# Patient Record
Sex: Male | Born: 1980
Health system: Southern US, Community
[De-identification: ages and names within clinical notes are randomized; demographics above are authoritative.]

---

## 2011-10-02 ENCOUNTER — Ambulatory Visit: Payer: BC Managed Care – PPO | Admitting: Emergency Medicine

## 2011-10-02 VITALS — BP 136/89 | HR 52 | Temp 98.2°F | Resp 16 | Ht 69.75 in | Wt 219.4 lb

## 2011-10-02 DIAGNOSIS — L259 Unspecified contact dermatitis, unspecified cause: Secondary | ICD-10-CM

## 2011-10-02 DIAGNOSIS — R21 Rash and other nonspecific skin eruption: Secondary | ICD-10-CM

## 2011-10-02 DIAGNOSIS — L255 Unspecified contact dermatitis due to plants, except food: Secondary | ICD-10-CM

## 2011-10-02 MED ORDER — DESONIDE 0.05 % EX CREA: TOPICAL_CREAM | Freq: Three times a day (TID) | CUTANEOUS | Status: AC

## 2011-10-02 MED ORDER — METHYLPREDNISOLONE ACETATE 80 MG/ML IJ SUSP
80.0000 mg | Freq: Once | INTRAMUSCULAR | Status: AC
  Administered 2011-10-02: 80 mg via INTRAMUSCULAR

## 2011-10-02 MED ORDER — ACYCLOVIR 400 MG PO TABS: 400.0000 mg | ORAL_TABLET | Freq: Three times a day (TID) | ORAL | Status: AC

## 2011-10-02 NOTE — Progress Notes (Signed)
   Patient Name: Oscar Adams Date of Birth: Jun 23, 1980 Medical Record Number: [id number] Gender: male Date of Encounter: 10/02/2011  History of Present Illness:  Oscar Adams is a 31 y.o. very pleasant male patient who presents with the following:  Has history of chronic herpes simplex one on forehead.  Thinks he has a disseminated outbreak on his arms and legs as well as a rash on his abdomen at his belt buckle.  No other allergen contact nor use of new hygeine products  There is no problem list on file for this patient.  No past medical history on file. No past surgical history on file. History  Substance Use Topics  . Smoking status: Never Smoker   . Smokeless tobacco: Not on file  . Alcohol Use: Not on file   No family history on file. No Known Allergies  Medication list has been reviewed and updated.  Prior to Admission medications   Not on File    Review of Systems:  As per HPI, otherwise negative.    Physical Examination: Filed Vitals:   10/02/11 1054  BP: 136/89  Pulse: 52  Temp: 98.2 F (36.8 C)  Resp: 16   Filed Vitals:   10/02/11 1054  Height: 5' 9.75" (1.772 m)  Weight: 219 lb 6.4 oz (99.519 kg)   Body mass index is 31.71 kg/(m^2). Ideal Body Weight: Weight in (lb) to have BMI = 25: 172.6   GEN: WDWN, NAD, Non-toxic, A & O x 3 HEENT: Atraumatic, Normocephalic. Neck supple. No masses, No LAD. Ears and Nose: No external deformity. CV: RRR, No M/G/R. No JVD. No thrill. No extra heart sounds. PULM: CTA B, no wheezes, crackles, rhonchi. No retractions. No resp. distress. No accessory muscle use. ABD: S, NT, ND, +BS. No rebound. No HSM.  Weeping rash on mid lower abdomen at beltbuckle EXTR: No c/c/e NEURO Normal gait.  PSYCH: Normally interactive. Conversant. Not depressed or anxious appearing.  Calm demeanor.   Assessment and Plan: Contact dermatitis  Carmelina Dane, MD

## 2011-10-02 NOTE — Patient Instructions (Addendum)

## 2018-01-20 ENCOUNTER — Emergency Department (HOSPITAL_COMMUNITY)
Admission: EM | Admit: 2018-01-20 | Discharge: 2018-01-20 | Disposition: A | Discharge status: Home / Self Care | Payer: 59 | Attending: Emergency Medicine | Admitting: Emergency Medicine

## 2018-01-20 ENCOUNTER — Other Ambulatory Visit: Payer: Self-pay

## 2018-01-20 ENCOUNTER — Emergency Department (HOSPITAL_COMMUNITY): Payer: 59

## 2018-01-20 ENCOUNTER — Encounter (HOSPITAL_COMMUNITY): Payer: Self-pay

## 2018-01-20 DIAGNOSIS — Y9375 Activity, martial arts: Secondary | ICD-10-CM | POA: Insufficient documentation

## 2018-01-20 DIAGNOSIS — X509XXA Other and unspecified overexertion or strenuous movements or postures, initial encounter: Secondary | ICD-10-CM | POA: Diagnosis not present

## 2018-01-20 DIAGNOSIS — S43101A Unspecified dislocation of right acromioclavicular joint, initial encounter: Secondary | ICD-10-CM | POA: Diagnosis not present

## 2018-01-20 DIAGNOSIS — Y92838 Other recreation area as the place of occurrence of the external cause: Secondary | ICD-10-CM | POA: Insufficient documentation

## 2018-01-20 DIAGNOSIS — S4991XA Unspecified injury of right shoulder and upper arm, initial encounter: Secondary | ICD-10-CM | POA: Diagnosis present

## 2018-01-20 DIAGNOSIS — Y998 Other external cause status: Secondary | ICD-10-CM | POA: Diagnosis not present

## 2018-01-20 MED ORDER — KETOROLAC TROMETHAMINE 30 MG/ML IJ SOLN
15.0000 mg | Freq: Once | INTRAMUSCULAR | Status: AC
  Administered 2018-01-20: 15 mg via INTRAMUSCULAR
  Filled 2018-01-20: qty 1

## 2018-01-20 MED ORDER — IBUPROFEN 400 MG PO TABS: 400.0000 mg | ORAL_TABLET | Freq: Three times a day (TID) | ORAL | 0 refills | Status: AC

## 2018-01-20 NOTE — Discharge Instructions (Signed)
Please be sure to follow-up with our orthopedic colleagues for further evaluation and management.

## 2018-01-20 NOTE — ED Provider Notes (Signed)
[facility]-EMERGENCY DEPT Provider Note   CSN: [id number] Arrival date & time: 01/20/18  7829     History   Chief Complaint Chief Complaint  Patient presents with  . Shoulder Injury    HPI Oscar Adams is a 37 y.o. male.  HPI Presents with acute onset right shoulder pain. Patient was in jujitsu practice, had his arm pulled by another participant, and felt sudden onset of pain, which has been persistent since that time. This occurred 1 hour ago. Pain is sharp, severe, focally in the superior aspect of the shoulder. No distal loss of sensation or weakness, nor other injuries. No medication taken for pain relief. Pain is worse with any motion of the arm.  History reviewed. No pertinent past medical history.  There are no active problems to display for this patient.   History reviewed. No pertinent surgical history.      Home Medications    Prior to Admission medications   Not on File    Family History Family History  Problem Relation Age of Onset  . Healthy Mother   . Healthy Father     Social History Social History   Tobacco Use  . Smoking status: Never Smoker  . Smokeless tobacco: Never Used  Substance Use Topics  . Alcohol use: Yes    Comment: moderate use  . Drug use: Never     Allergies   Patient has no known allergies.   Review of Systems Review of Systems  Constitutional: Negative for fever.  Respiratory: Negative for shortness of breath.   Cardiovascular: Negative for chest pain.  Musculoskeletal:       Negative aside from HPI  Skin:       Negative aside from HPI  Allergic/Immunologic: Negative for immunocompromised state.  Neurological: Negative for weakness.     Physical Exam Updated Vital Signs BP (!) 139/98 (BP Location: Left Arm)   Pulse 84   Temp 98.1 F (36.7 C) (Oral)   Resp 16   Ht 5\' 10"  (1.778 m)   Wt 88.5 kg   SpO2 100%   BMI 27.98 kg/m   Physical Exam  Constitutional: He is oriented  to person, place, and time. He appears well-developed. No distress.  HENT:  Head: Normocephalic and atraumatic.  Eyes: Conjunctivae and EOM are normal.  Cardiovascular: Normal rate, regular rhythm and intact distal pulses.  Pulmonary/Chest: Effort normal. No respiratory distress.  Musculoskeletal: He exhibits no edema.       Left shoulder: Normal.       Right elbow: Normal.      Arms: Neurological: He is alert and oriented to person, place, and time.  Skin: Skin is warm and dry.  Psychiatric: He has a normal mood and affect.  Nursing note and vitals reviewed.    ED Treatments / Results  Labs (all labs ordered are listed, but only abnormal results are displayed) Labs Reviewed - No data to display  EKG None  Radiology Dg Shoulder Right  Result Date: 01/20/2018 CLINICAL DATA:  Injury while practicing jujitsu EXAM: RIGHT SHOULDER - 2+ VIEW COMPARISON:  None. FINDINGS: Frontal and Y scapular images were obtained. There is complete acromioclavicular separation with the clavicle superior to and slightly overlapping the medial acromion. There is milder apparent coracoclavicular separation. There is no fracture or frank dislocation. No appreciable joint space narrowing or erosion. No intra-articular calcification. Visualized right lung clear. IMPRESSION: Complete acromioclavicular separation with mild overriding of the lateral clavicle and medial acromion. The clavicle is  displaced superior to the acromion. There is milder coracoclavicular separation. No fracture or frank dislocation. No underlying arthropathy. Electronically Signed   By: Bretta Bang III M.D.   On: 01/20/2018 08:55    Procedures Procedures (including critical care time)  Medications Ordered in ED Medications  ketorolac (TORADOL) 30 MG/ML injection 15 mg (15 mg Intramuscular Given 01/20/18 0830)     Initial Impression / Assessment and Plan / ED Course  I have reviewed the triage vital signs and the nursing  notes.  Pertinent labs & imaging results that were available during my care of the patient were reviewed by me and considered in my medical decision making (see chart for details).     9:05 AM On repeat exam the patient is awake and alert, is now in a sling. We reviewed the x-ray images together, and I demonstrated the [facility] separation to him. Patient will follow-up with orthopedics w ongoing therapy with NSAID and ice.   Final Clinical Impressions(s) / ED Diagnoses   Final diagnoses:  AC separation, right, initial encounter     Gerhard Munch, MD 01/20/18 [id number]

## 2018-01-20 NOTE — ED Notes (Signed)
Patient transported to X-ray 

## 2018-01-20 NOTE — ED Triage Notes (Signed)
Patient was at [facility] practice and heard and felt a pop of the right shoulder.

## 2018-01-20 NOTE — ED Notes (Signed)
After immobilizer applied, good distal pulse and cap refills

## 2019-10-13 IMAGING — CR DG SHOULDER 2+V*R*
2 series · 2 of 2 positions shown · non-contrast
Comparison: None.

CLINICAL DATA: Injury while practicing jujitsu

EXAM:
RIGHT SHOULDER - 2+ VIEW

[w shoulder external right]
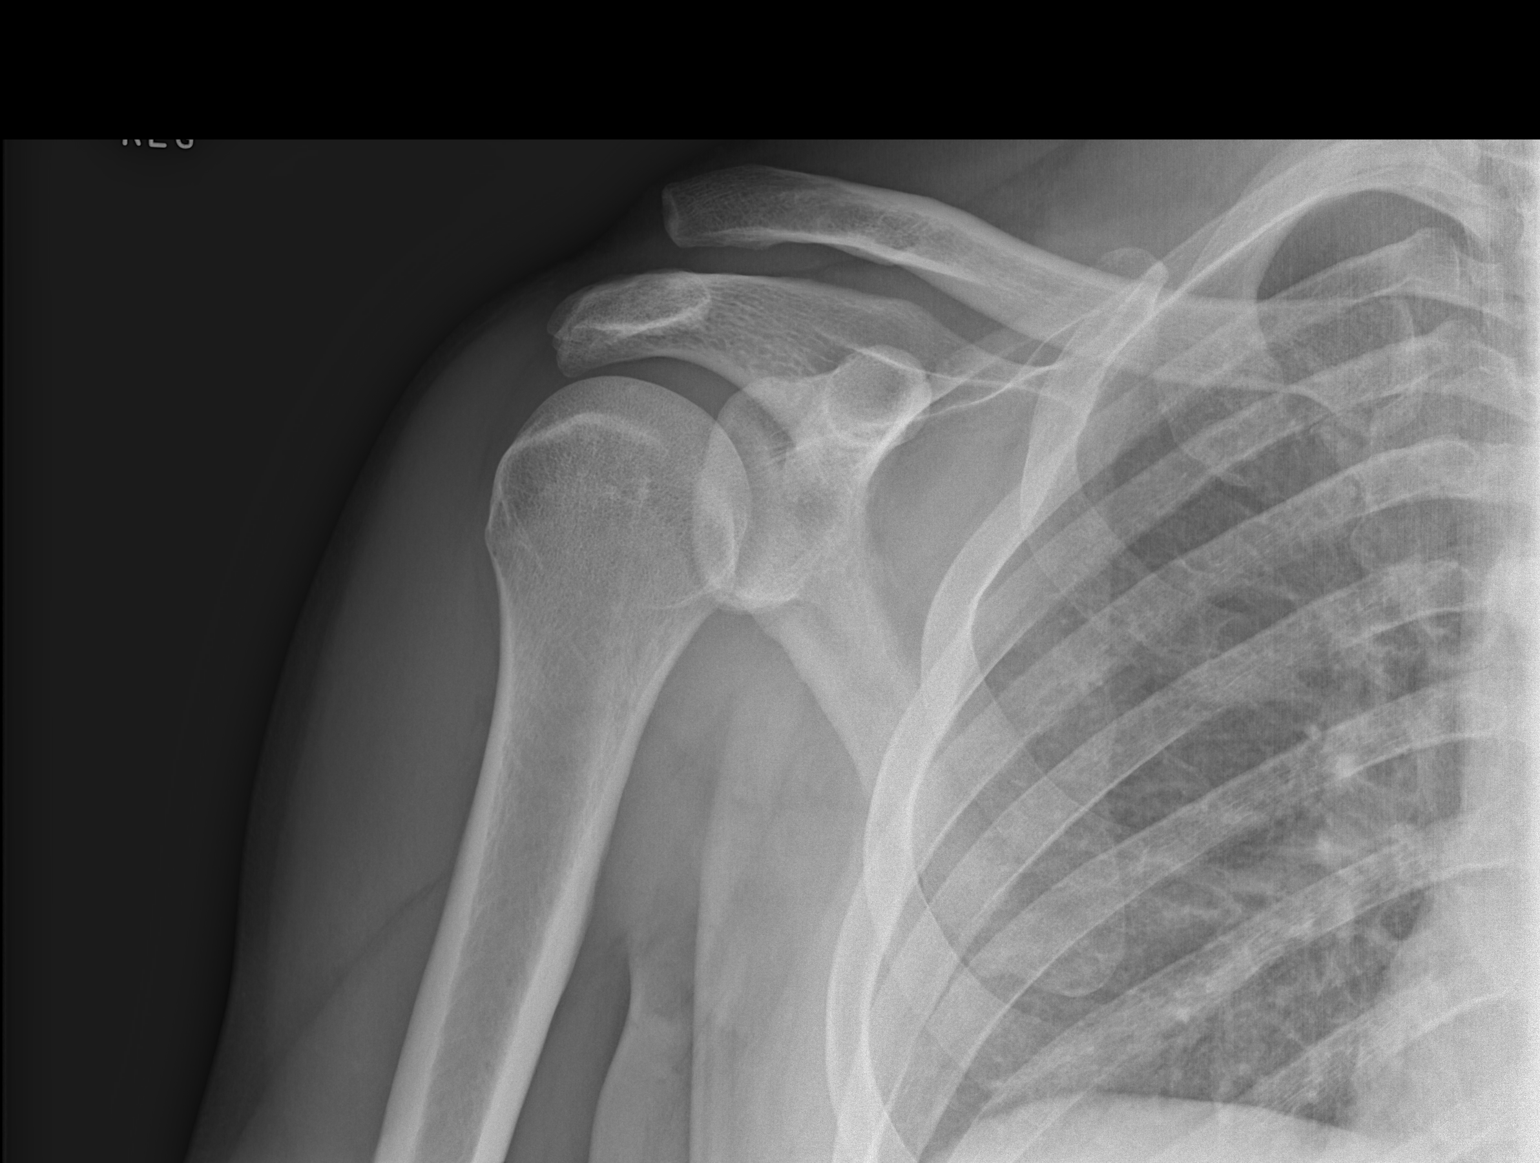

[w shoulder y-view right]
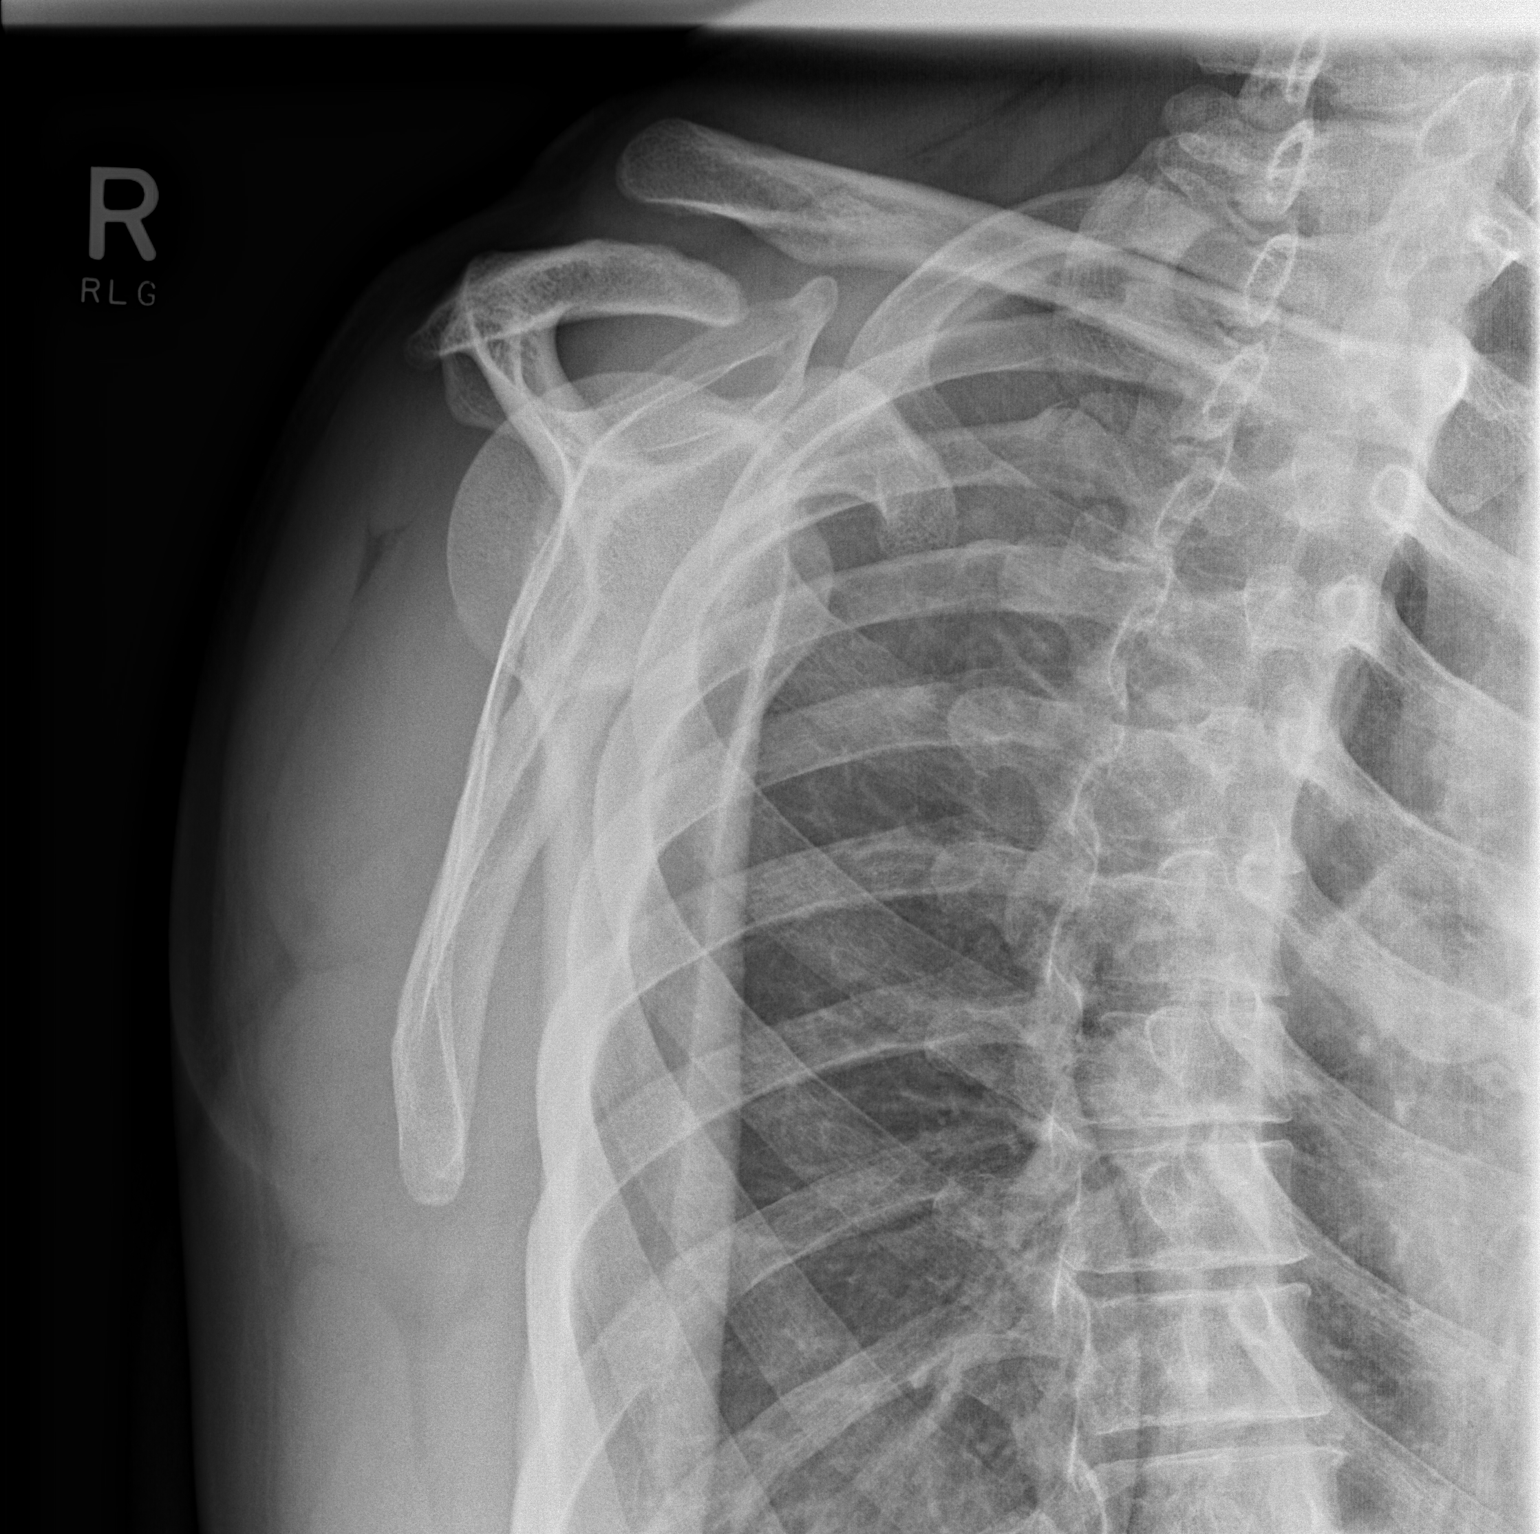

[2 of 2 positions shown; findings below may reference images not displayed]

FINDINGS: Frontal and Y scapular images were obtained. There is complete
acromioclavicular separation with the clavicle superior to and
slightly overlapping the medial acromion. There is milder apparent
coracoclavicular separation. There is no fracture or frank
dislocation. No appreciable joint space narrowing or erosion. No
intra-articular calcification. Visualized right lung clear.
IMPRESSION: Complete acromioclavicular separation with mild overriding of the
lateral clavicle and medial acromion. The clavicle is displaced
superior to the acromion. There is milder coracoclavicular
separation. No fracture or frank dislocation. No underlying
arthropathy.

## 2021-02-25 ENCOUNTER — Ambulatory Visit: Payer: 59 | Admitting: Family Medicine

## 2021-03-02 ENCOUNTER — Ambulatory Visit (INDEPENDENT_AMBULATORY_CARE_PROVIDER_SITE_OTHER): Payer: Managed Care, Other (non HMO) | Admitting: Family Medicine

## 2021-03-02 VITALS — BP 150/94 | Ht 70.0 in | Wt 195.0 lb

## 2021-03-02 DIAGNOSIS — S4352XA Sprain of left acromioclavicular joint, initial encounter: Secondary | ICD-10-CM

## 2021-03-02 NOTE — Progress Notes (Addendum)
    SUBJECTIVE:   CHIEF COMPLAINT / HPI:   Left shoulder pain: Patient states that for about 2 weeks or so he has been having left shoulder pain.  He states he has been doing jujitsu as well as lifting particularly doing lots of push-ups in order to try to put on muscle and noted pain at the anterior part of his left shoulder.  He denies any specific traumas.  States that he has been put in the "triangle" while sparring with jujitsu a few times but denies any specific time that caused sharp pain.  He states that he is reduced his lifting and stopped doing push-ups for the past 2 weeks and his pain has mostly improved.  He denies pain with movement of his shoulder.   OBJECTIVE:   BP (!) 150/94   Ht 5\' 10"  (1.778 m)   Wt 195 lb (88.5 kg)   BMI 27.98 kg/m    General: NAD, pleasant, able to participate in exam MSK: Shoulder, left: No evidence of bony deformity, asymmetry, or muscle atrophy; No tenderness over long head of biceps (bicipital groove).  Mild TTP at [facility] joint. Full active and passive range of motion (180 flex [facility] /150Abd /90ER /70IR), Thumb to T12 without significant tenderness. Strength 5/5 throughout. No abnormal scapular function observed. Sensation intact. Peripheral pulses intact.  Special testing: Negative Yergason's, negative empty can, negative drop arm test, with cross arm AC testing does have mild amount of discomfort in the left AC.  ASSESSMENT/PLAN:   Left AC joint sprain: 40 year old male with left shoulder pain for 2 weeks that worsened with jujitsu as well as lifting weights particularly push-ups.  He has stopped lifting weights and stopped doing push-ups for about 2 weeks and his pain as significantly improved and mostly resolved.  On physical exam he has some minor discomfort with the left AC with palpation which is exacerbated when doing a cross arm AC special testing.  Most likely grade 1 sprain.  Denies any trauma to the area but has noticed it worsening  after being put in the "triangle" maneuver during jujitsu which does put stress on the clavicle and AC joint.  Discussed easing back into lifting and exercise using pain as a guide.  No indication for imaging.  Follow-up as needed.  41, DO [facility]

## 2021-03-03 ENCOUNTER — Encounter: Payer: Self-pay | Admitting: Family Medicine

## 2021-03-03 NOTE — Addendum Note (Signed)
Addended by: Lenda Kelp on: 03/03/2021 09:03 AM   Modules accepted: Level of Service

## 2022-02-16 ENCOUNTER — Ambulatory Visit
Admission: RE | Admit: 2022-02-16 | Discharge: 2022-02-16 | Disposition: A | Discharge status: Home / Self Care | Payer: Managed Care, Other (non HMO) | Source: Ambulatory Visit | Attending: Chiropractic Medicine | Admitting: Chiropractic Medicine

## 2022-02-16 ENCOUNTER — Other Ambulatory Visit: Payer: Self-pay | Admitting: Chiropractic Medicine

## 2022-02-16 DIAGNOSIS — M542 Cervicalgia: Secondary | ICD-10-CM

## 2022-03-18 ENCOUNTER — Encounter: Payer: Self-pay | Admitting: Neurology

## 2022-03-18 ENCOUNTER — Ambulatory Visit (INDEPENDENT_AMBULATORY_CARE_PROVIDER_SITE_OTHER): Payer: Managed Care, Other (non HMO) | Admitting: Neurology

## 2022-03-18 VITALS — BP 128/78 | HR 42 | Ht 70.0 in | Wt 195.5 lb

## 2022-03-18 DIAGNOSIS — M5412 Radiculopathy, cervical region: Secondary | ICD-10-CM | POA: Diagnosis not present

## 2022-03-18 MED ORDER — MELOXICAM 15 MG PO TABS: 15.0000 mg | ORAL_TABLET | Freq: Every day | ORAL | 5 refills | Status: AC

## 2022-03-18 MED ORDER — GABAPENTIN 300 MG PO CAPS: 600.0000 mg | ORAL_CAPSULE | Freq: Three times a day (TID) | ORAL | 5 refills | Status: AC

## 2022-03-18 NOTE — Progress Notes (Signed)
Chief Complaint  Patient presents with   New Patient (Initial Visit)    Rm 15. Alone. 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      ASSESSMENT AND PLAN  Oscar Adams is a 41 y.o. male   Right cervical radiculopathy  Ongoing for 6 months, failed NSAIDs,  X-ray showed multilevel degenerative changes  MRI of cervical spine  EMG nerve conduction study  Gabapentin 300 mg and titrating to 600 mg 3 times daily as needed, Mobic 15 mg as needed  DIAGNOSTIC DATA (LABS, IMAGING, TESTING) - I reviewed patient records, labs, notes, testing and imaging myself where available.   MEDICAL HISTORY:  Thomson Herbers, is a 41 year old right-handed male, seen in request by primary care physician Dr. Nicholos Johns, [facility], for right neck pain radiating pain to right upper extremity, initial evaluation March 18, 2022  I reviewed and summarized the referring note.PMHX.  He works as a Quarry manager, does workout regularly, including weightlifting, jujitsu  Since June 2023, he began to have right shoulder blade pain, in July 2023, it becomes much worse, constant, difficulty finding a comfortable position, woke up in pain,  Was not able to continue his workout, denies left upper extremity, lower extremity weakness, denies significant right upper extremity weakness,  Personally reviewed x-ray of cervical February 19, 2022, moderate right foraminal stenosis C2-3, degenerative disc disease C5 7   PHYSICAL EXAM:   Vitals:   03/18/22 0823  BP: 128/78  Pulse: (!) 42  Weight: 195 lb 8 oz (88.7 kg)  Height: 5\' 10"  (1.778 m)   Not recorded     Body mass index is 28.05 kg/m.  PHYSICAL EXAMNIATION:  Gen: NAD, conversant, well nourised, well groomed                     Cardiovascular: Regular rate rhythm, no peripheral edema, warm, nontender. Eyes: Conjunctivae clear without exudates or hemorrhage Neck: Supple, no carotid bruits. Pulmonary: Clear to  auscultation bilaterally   NEUROLOGICAL EXAM:  MENTAL STATUS: Speech/cognition: Awake, alert, oriented to history taking and casual conversation CRANIAL NERVES: CN II: Visual fields are full to confrontation. Pupils are round equal and briskly reactive to light. CN III, IV, VI: extraocular movement are normal. No ptosis. CN V: Facial sensation is intact to light touch CN VII: Face is symmetric with normal eye closure  CN VIII: Hearing is normal to causal conversation. CN IX, X: Phonation is normal. CN XI: Head turning and shoulder shrug are intact  MOTOR: Mild right shoulder abduction, external rotation, grip, finger abduction, right wrist flexion weakness  REFLEXES: Reflexes R/L:  biceps 2/2+, triceps 1/2, knees 3/3, and ankles. Plantar responses are flexor.  SENSORY: Intact to light touch, pinprick and vibratory sensation are intact in fingers and toes.  COORDINATION: There is no trunk or limb dysmetria noted.  GAIT/STANCE: Posture is normal. Gait is steady with normal steps, base, arm swing, and turning. Heel and toe walking are normal. Tandem gait is normal.  Romberg is absent.  REVIEW OF SYSTEMS:  Full 14 system review of systems performed and notable only for as above All other review of systems were negative.   ALLERGIES: No Known Allergies  HOME MEDICATIONS: No current outpatient medications on file.   No current facility-administered medications for this visit.    PAST MEDICAL HISTORY: History reviewed. No pertinent past medical history.  PAST SURGICAL HISTORY: History reviewed. No pertinent surgical history.  FAMILY HISTORY: Family History  Problem Relation Age of Onset   Healthy  Mother    Healthy Father     SOCIAL HISTORY: Social History   Socioeconomic History   Marital status: Single    Spouse name: Not on file   Number of children: Not on file   Years of education: Not on file   Highest education level: Not on file  Occupational History    Not on file  Tobacco Use   Smoking status: Never   Smokeless tobacco: Never  Vaping Use   Vaping Use: Never used  Substance and Sexual Activity   Alcohol use: Yes    Comment: moderate use   Drug use: Never   Sexual activity: Not on file  Other Topics Concern   Not on file  Social History Narrative   Not on file   Social Determinants of Health   Financial Resource Strain: Not on file  Food Insecurity: Not on file  Transportation Needs: Not on file  Physical Activity: Not on file  Stress: Not on file  Social Connections: Not on file  Intimate Partner Violence: Not on file      Levert Feinstein, M.D. Ph.D.  [facility] [address] [place], [place] [address] Ph: [phone] Fax: [phone]  CC:  Georgianne Fick, MD [address] [place],  [place] [address]  Georgianne Fick, MD

## 2022-03-19 ENCOUNTER — Telehealth: Payer: Self-pay | Admitting: Neurology

## 2022-03-19 NOTE — Telephone Encounter (Signed)
Sent to [employer], they obtain [place].

## 2022-04-13 ENCOUNTER — Encounter: Payer: Self-pay | Admitting: Neurology

## 2022-04-27 NOTE — Telephone Encounter (Signed)
I do not see where this MRI was sent anywhere else either

## 2022-04-27 NOTE — Telephone Encounter (Signed)
Pt is calling. Requesting a call back to go over MRI results.

## 2022-04-28 ENCOUNTER — Encounter: Payer: Self-pay | Admitting: Neurology

## 2022-04-28 NOTE — Telephone Encounter (Addendum)
Please call patient, MRI of cervical spine done at [facility] showed multilevel degenerative changes, most noticeable at C5-6, with described severe spinal canal stenosis, variable degree of foraminal narrowing  Please put him on scheduled this coming Friday, April 30, 2022 for EMG nerve conduction study, advised him to bring MRI CD for [place] to review    C5-6: Degenerative changes, including a large right-sided disc-osteophyte complex that involves the midline and right lateral spinal canal and is continuous laterally with a right uncovertebral osteophyte. Severe spinal canal stenosis (right greater than left). Severe right neural foraminal stenosis. Moderate left neural foraminal stenosis.

## 2022-04-28 NOTE — Telephone Encounter (Signed)
Attempted to reach out to patient and did not receive an answer, if he calls back will you please ask where his scan was completed and if he knows the date? We are unable to find record of the MRI being completed. thanks

## 2022-04-28 NOTE — Telephone Encounter (Signed)
I called the pt and we discussed results. He verbalized understanding and agreement to the plan.  He will report to the clinic on 04/30/2022 for Nerve Conduction at 1130 check in at 11 am. He reports he has copy of MRI and will bring to his appt on Friday.

## 2022-04-30 ENCOUNTER — Ambulatory Visit (INDEPENDENT_AMBULATORY_CARE_PROVIDER_SITE_OTHER): Payer: Managed Care, Other (non HMO) | Admitting: Neurology

## 2022-04-30 ENCOUNTER — Encounter: Payer: Managed Care, Other (non HMO) | Admitting: Neurology

## 2022-04-30 ENCOUNTER — Encounter: Payer: Self-pay | Admitting: Neurology

## 2022-04-30 VITALS — BP 134/85 | HR 53 | Ht 70.0 in | Wt 193.0 lb

## 2022-04-30 DIAGNOSIS — M5412 Radiculopathy, cervical region: Secondary | ICD-10-CM

## 2022-04-30 MED ORDER — CELECOXIB 100 MG PO CAPS: 100.0000 mg | ORAL_CAPSULE | Freq: Two times a day (BID) | ORAL | 5 refills | Status: AC

## 2022-04-30 NOTE — Patient Instructions (Signed)
[facility]  [address] [place]  [address] [place], [place] [address]  HOURS Monday-Friday: 8am-5pm  PHONE [phone]  FAX Please fax NEW patient referrals to: [phone] For Medical Records or any other needs, please fax: [phone]

## 2022-04-30 NOTE — Progress Notes (Signed)
ASSESSMENT AND PLAN  Oscar Adams is a 42 y.o. male   Right cervical radiculopathy  Ongoing for 6 months,   MRI of cervical spine from [facility] March 29, 2022 showed evidence of multilevel degenerative changes, most noticeable at C5-6, large right-sided disc osteophyte complex, involving midline and right lateral spinal canal, and is continuous laterally with the right uncovertebral osteophyte, with severe right greater than left spinal canal stenosis, severe right foraminal stenosis, moderate on the left side  EMG nerve conduction study confirmed right C5-6-7 radiculopathy, but I do not see significant myelopathic changes on examination,  Gabapentin 300 mg and titrating to 600 mg 3 times daily as needed, Mobic 15 mg as needed fail to help his symptoms, will change to Celebrex 100 mg twice daily as needed  Refer to neurosurgeon for potential cervical decompression  DIAGNOSTIC DATA (LABS, IMAGING, TESTING) - I reviewed patient records, labs, notes, testing and imaging myself where available.   MEDICAL HISTORY:  Oscar Adams, is a 42 year old right-handed male, seen in request by primary care physician Dr. Ashby Dawes, Mauro Kaufmann, for right neck pain radiating pain to right upper extremity, initial evaluation March 18, 2022  I reviewed and summarized the referring note.PMHX.  He works as a Dance movement psychotherapist, does workout regularly, including weightlifting, jujitsu  Since June 2023, he began to have right shoulder blade pain, in July 2023, it becomes much worse, constant, difficulty finding a comfortable position, woke up in pain,  Was not able to continue his workout, denies left upper extremity, lower extremity weakness, denies significant right upper extremity weakness,  Personally reviewed x-ray of cervical February 19, 2022, moderate right foraminal stenosis C2-3, degenerative disc disease C5 7  Update April 30, 2022: He continues to have right shoulder achy pain, right  arm numbness, weakness  EMG nerve conduction study today confirmed active right C5-6, 7 radiculopathy  Personally reviewed MRI cervical spine December 2023 from [facility], multilevel degenerative changes, most prominent at C5-6, large right-sided disc osteophyte complex involving right lateral spinal canal, right greater than left mild to moderate canal stenosis,  PHYSICAL EXAM:   Today's Vitals   04/30/22 1132  BP: 134/85  Pulse: (!) 53  Weight: 193 lb (87.5 kg)  Height: 5\' 10"  (1.778 m)   Body mass index is 27.69 kg/m.   PHYSICAL EXAMNIATION:  Gen: NAD, conversant, well nourised, well groomed                     Cardiovascular: Regular rate rhythm, no peripheral edema, warm, nontender. Eyes: Conjunctivae clear without exudates or hemorrhage Neck: Supple, no carotid bruits. Pulmonary: Clear to auscultation bilaterally   NEUROLOGICAL EXAM:  MENTAL STATUS: Speech/cognition: Awake, alert, oriented to history taking and casual conversation CRANIAL NERVES: CN II: Visual fields are full to confrontation. Pupils are round equal and briskly reactive to light. CN III, IV, VI: extraocular movement are normal. No ptosis. CN V: Facial sensation is intact to light touch CN VII: Face is symmetric with normal eye closure  CN VIII: Hearing is normal to causal conversation. CN IX, X: Phonation is normal. CN XI: Head turning and shoulder shrug are intact  MOTOR: Mild right shoulder abduction, external rotation, grip, finger abduction, right wrist flexion weakness  REFLEXES: Reflexes R/L:  biceps 2/2+, triceps 1/2, knees 3/3, and ankles. Plantar responses are flexor.  SENSORY: Intact to light touch, pinprick and vibratory sensation are intact in fingers and toes.  COORDINATION: There is no trunk or limb dysmetria noted.  GAIT/STANCE: Posture is normal. Gait is steady with normal steps, base, arm swing, and turning. Heel and toe walking are normal. Tandem gait is normal.   Romberg is absent.  REVIEW OF SYSTEMS:  Full 14 system review of systems performed and notable only for as above All other review of systems were negative.   ALLERGIES: No Known Allergies  HOME MEDICATIONS: Current Outpatient Medications  Medication Sig Dispense Refill   gabapentin (NEURONTIN) 300 MG capsule Take 2 capsules (600 mg total) by mouth 3 (three) times daily. 180 capsule 5   meloxicam (MOBIC) 15 MG tablet Take 1 tablet (15 mg total) by mouth daily. After meal 30 tablet 5   No current facility-administered medications for this visit.    PAST MEDICAL HISTORY: No past medical history on file.  PAST SURGICAL HISTORY: No past surgical history on file.  FAMILY HISTORY: Family History  Problem Relation Age of Onset   Healthy Mother    Healthy Father     SOCIAL HISTORY: Social History   Socioeconomic History   Marital status: Single    Spouse name: Not on file   Number of children: Not on file   Years of education: Not on file   Highest education level: Not on file  Occupational History   Not on file  Tobacco Use   Smoking status: Never   Smokeless tobacco: Never  Vaping Use   Vaping Use: Never used  Substance and Sexual Activity   Alcohol use: Yes    Comment: moderate use   Drug use: Never   Sexual activity: Not on file  Other Topics Concern   Not on file  Social History Narrative   Not on file   Social Determinants of Health   Financial Resource Strain: Not on file  Food Insecurity: Not on file  Transportation Needs: Not on file  Physical Activity: Not on file  Stress: Not on file  Social Connections: Not on file  Intimate Partner Violence: Not on file      Oscar Adams, M.D. Ph.D.  [facility] [address] [place], [place] [address] Ph: [phone] Fax: [phone]  CC:  Oscar Adams, [place],   [address]  Oscar Seashore, MD

## 2022-04-30 NOTE — Procedures (Signed)
Full Name: Oscar Adams Gender: Male MRN #: [id number] Date of Birth: 04-Apr-1981    Visit Date: 04/30/2022 11:37 Age: 42 Years Examining Physician: Dr. Marcial Pacas Referring Physician: Dr. Marcial Pacas Height: 5 feet 10 inch History: 42 year old right-handed male, presenting with right neck pain radiating pain to right shoulder blade, right arm weakness,  Summary of the test:  Nerve conduction study:. Bilateral median and ulnar motor responses were normal.  Electromyography: Selected needle examination of bilateral lower upper extremity muscles and cervical paraspinal muscles were performed.  There is evidence of mild active neuropathic changes involving right C5, 6, 7 myotomes.  There is evidence of increased insertional activity, polyphasic motor unit potential at right lower cervical paraspinal muscles.  Conclusion: This is an abnormal study.  There is electrodiagnostic evidence of right cervical radiculopathy, involving the right C5, 6 7 myotomes.  There is no evidence of right upper extremity focal neuropathy.    ------------------------------- Marcial Pacas, M.D. PhD  [facility] [address] [place], [place] [address] Tel: [phone] Fax: [phone]  Verbal informed consent was obtained from the patient, patient was informed of potential risk of procedure, including bruising, bleeding, hematoma formation, infection, muscle weakness, muscle pain, numbness, among others.        [place]    Nerve / Sites Muscle Latency Ref. Amplitude Ref. Rel Amp Segments Distance Velocity Ref. Area    ms ms mV mV %  cm m/s m/s mVms  R Median - APB     Wrist APB 4.4 ?4.4 7.3 ?4.0 100 Wrist - APB 7   22.4     Upper arm APB 8.3  7.2  98.4 Upper arm - Wrist 21 54 ?49 24.2  L Median - APB     Wrist APB 4.2 ?4.4 5.0 ?4.0 100 Wrist - APB 7   14.3     Upper arm APB 9.1  4.5  90.4 Upper arm - Wrist 26 53 ?49 16.3  R Ulnar - ADM     Wrist ADM 3.2 ?3.3 6.7 ?6.0  100 Wrist - ADM 7   15.0     B.Elbow ADM 6.0  5.3  78.6 B.Elbow - Wrist 16 58 ?49 11.8     A.Elbow ADM 8.5  5.2  98.2 A.Elbow - B.Elbow 16 63 ?49 11.5  L Ulnar - ADM     Wrist ADM 3.0 ?3.3 12.5 ?6.0 100 Wrist - ADM 7   22.3     B.Elbow ADM 5.6  12.0  96.2 B.Elbow - Wrist 16.5 62 ?49 22.2     A.Elbow ADM 8.4  9.7  80.9 A.Elbow - B.Elbow 17 61 ?49 20.1             SNC    Nerve / Sites Rec. Site Peak Lat Ref.  Amp Ref. Segments Distance Peak Diff Ref.    ms ms V V  cm ms ms  R Median, Ulnar - Transcarpal comparison     Median Palm Wrist 2.0 ?2.2 39 ?35 Median Palm - Wrist 8       Ulnar Palm Wrist 2.0 ?2.2 12 ?12 Ulnar Palm - Wrist 8          Median Palm - Ulnar Palm  0.0 ?0.4  R Median - Orthodromic (Dig II, Mid palm)     Dig II Wrist 3.3 ?3.4 10 ?10 Dig II - Wrist 13    L Median - Orthodromic (Dig II, Mid palm)  Dig II Wrist 3.3 ?3.4 11 ?10 Dig II - Wrist 13    R Ulnar - Orthodromic, (Dig V, Mid palm)     Dig V Wrist 2.9 ?3.1 6 ?5 Dig V - Wrist 11    L Ulnar - Orthodromic, (Dig V, Mid palm)     Dig V Wrist 2.9 ?3.1 21 ?5 Dig V - Wrist 5                 F  Wave    Nerve F Lat Ref.   ms ms  R Ulnar - ADM 28.5 ?32.0  L Ulnar - ADM 28.2 ?32.0         EMG Summary Table    Spontaneous MUAP Recruitment  Muscle IA Fib PSW Fasc Other Amp Dur. Poly Pattern  R. Brachioradialis Normal None None None _______ Normal Normal Normal Reduced  R. Biceps brachii Normal None None None _______ Normal Normal Normal Reduced  R. Deltoid Normal None None None _______ Normal Normal Normal Reduced  R. Triceps brachii Normal None None None _______ Normal Normal Normal Reduced  R. Pronator teres Normal None None None _______ Normal Normal Normal Reduced  R. Extensor digitorum communis Normal None None None _______ Normal Normal Normal Reduced  R. Cervical paraspinals Increased 1+ 1+ None _______ Normal Normal Normal Reduced  L. First dorsal interosseous Normal None None None _______ Normal Normal  Normal Normal  L. Pronator teres Normal None None None _______ Normal Normal Normal Normal  L. Biceps brachii Normal None None None _______ Normal Normal Normal Normal  L. Deltoid Normal None None None _______ Normal Normal Normal Normal  L. Triceps brachii Normal None None None _______ Normal Normal Normal Normal

## 2022-05-04 ENCOUNTER — Telehealth: Payer: Self-pay | Admitting: Neurology

## 2022-05-04 ENCOUNTER — Encounter: Payer: Self-pay | Admitting: Neurology

## 2022-05-04 NOTE — Telephone Encounter (Addendum)
Referral for Neurosurgery fax to [facility]. Phone: [phone], Fax: [phone].  Notified pt referral has been sent.

## 2023-08-02 ENCOUNTER — Ambulatory Visit (INDEPENDENT_AMBULATORY_CARE_PROVIDER_SITE_OTHER): Payer: Managed Care, Other (non HMO) | Admitting: Otolaryngology

## 2023-08-02 ENCOUNTER — Encounter (INDEPENDENT_AMBULATORY_CARE_PROVIDER_SITE_OTHER): Payer: Self-pay

## 2023-08-03 ENCOUNTER — Encounter (INDEPENDENT_AMBULATORY_CARE_PROVIDER_SITE_OTHER): Payer: Self-pay

## 2023-08-03 ENCOUNTER — Ambulatory Visit (INDEPENDENT_AMBULATORY_CARE_PROVIDER_SITE_OTHER): Admitting: Otolaryngology

## 2023-08-03 VITALS — BP 123/69 | HR 57 | Ht 70.0 in | Wt 186.0 lb

## 2023-08-03 DIAGNOSIS — H61119 Acquired deformity of pinna, unspecified ear: Secondary | ICD-10-CM | POA: Diagnosis not present

## 2023-08-03 NOTE — Addendum Note (Signed)
 Addended by: Danon Lograsso on: 08/03/2023 11:23 AM   Modules accepted: Level of Service

## 2023-08-03 NOTE — Progress Notes (Addendum)
 Dear Dr. Gloria Lares, Here is my assessment for our mutual patient, Oscar Adams. Thank you for allowing me the opportunity to care for your patient. Please do not hesitate to contact me should you have any other questions. Sincerely, Dr. Milon Aloe  Otolaryngology Clinic Note Referring provider: Dr. Gloria Lares HPI:  Oscar Adams is a 43 y.o. male kindly referred by Dr. Gloria Lares for evaluation of ear hematoma.   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 Managed to avoid ear trauma but a couple of months ago noticed he had ear swelling on right but no pain or discomfort. Most likely suspects due to his martial arts. By time he saw his PCP, the area had scarred. Referred here to establish care and discuss options. Otherwise no issues. Does not bother him cosmetically significantly currently. No issues on left. No other episodes. No AI Sx, no pain/redness episodes, no nasal issues or throat issues. Patient denies: ear pain, fullness, vertigo, drainage, tinnitus Patient additionally denies: deep pain in ear canal, eustachian tube symptoms such as popping, crackling, sensitive to pressure changes Patient also denies barotrauma, vestibular suppressant use, ototoxic medication use Prior ear surgery: no  Personal or FHx of bleeding dz or anesthesia difficulty: no  GLP-1: no AP/AC: no  Tobacco: no  PMHx: Cervical radiculopathy  Independent Review of Additional Tests or Records:  Dr. Gloria Lares (IM) 05/16/2023 notes reviewed and uploaded or available in chart: noted ear swollen, both ears, right worse than left; he does do MMA and had prior redness and swelling, resolved. Ref to ENT  PMH/Meds/All/SocHx/FamHx/ROS:  History reviewed. No pertinent past medical history.   History reviewed. No pertinent surgical history.  Family History  Problem Relation Age of Onset   Healthy Mother    Healthy Father      Social Connections: Unknown  (03/18/2022)   Received from [facility]   Social Network    Social Network: Not on file      Current Outpatient Medications:    celecoxib  (CELEBREX ) 100 MG capsule, Take 1 capsule (100 mg total) by mouth 2 (two) times daily. (Patient not taking: Reported on 08/03/2023), Disp: 60 capsule, Rfl: 5   gabapentin  (NEURONTIN ) 300 MG capsule, Take 2 capsules (600 mg total) by mouth 3 (three) times daily. (Patient not taking: Reported on 08/03/2023), Disp: 180 capsule, Rfl: 5   meloxicam  (MOBIC ) 15 MG tablet, Take 1 tablet (15 mg total) by mouth daily. After meal (Patient not taking: Reported on 08/03/2023), Disp: 30 tablet, Rfl: 5   valACYclovir (VALTREX) 1000 MG tablet, 2 tablets as needed Orally every 12 hrs for 5 days, Disp: , Rfl:    Physical Exam:   There were no vitals taken for this visit.  Salient findings:  CN II-XII intact  Bilateral EAC clear and TM intact with well pneumatized middle ear spaces; left auricle morphologically normal; right auricle with "cauliflower ear" deformity primarily involving the antihelix area; helical rim and concha without any scarring; no current hematoma Anterior rhinoscopy: Septum deviates left; bilateral inferior turbinates without significant hypertrophy No lesions of oral cavity/oropharynx No obviously palpable neck masses/lymphadenopathy/thyromegaly No respiratory distress or stridor  Seprately Identifiable Procedures:   None  Impression & Plans:  Oscar Adams is a 43 y.o. male who does martial arts now with:  1. Acquired ear deformity    Right ear deformity most likely result of hematoma. No active hematoma. We discussed options - given he still plans to do martial arts - do not think excision  or other interventions would provide long-term benefit. Not bothering him cosmetically. We discussed padding strategies and importance of prompt follow up for any future hematoma. He understood. Call in case of any questions F/u as necessary  See below  regarding exact medications prescribed this encounter including dosages and route: No orders of the defined types were placed in this encounter.     Thank you for allowing me the opportunity to care for your patient. Please do not hesitate to contact me should you have any other questions.  Sincerely, Milon Aloe, MD Otolaryngologist (ENT), [facility] Phone: [phone] Fax: [phone]  08/03/2023, 11:18 AM   Of note: date of service was 08/03/2023 so no charge on 08/02/2023  I have personally spent 32 minutes involved in face-to-face and non-face-to-face activities for this patient on the day of the visit.  Professional time spent excludes any procedures performed but includes the following activities, in addition to those noted in the documentation: preparing to see the patient (review of outside documentation and results), performing a medically appropriate examination, counseling documenting in the electronic health record

## 2023-08-03 NOTE — Progress Notes (Signed)
 Dear Dr. Gloria Lares, Here is my assessment for our mutual patient, Oscar Adams. Thank you for allowing me the opportunity to care for your patient. Please do not hesitate to contact me should you have any other questions. Sincerely, Dr. Milon Aloe  Otolaryngology Clinic Note Referring provider: Dr. Gloria Lares HPI:  Oscar Adams is a 43 y.o. male kindly referred by Dr. Gloria Lares for evaluation of ear hematoma.   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 Managed to avoid ear trauma but a couple of months ago noticed he had ear swelling on right but no pain or discomfort. Most likely suspects due to his martial arts. By time he saw his PCP, the area had scarred. Referred here to establish care and discuss options. Otherwise no issues. Does not bother him cosmetically significantly currently. No issues on left. No other episodes. No AI Sx, no pain/redness episodes, no nasal issues or throat issues. Patient denies: ear pain, fullness, vertigo, drainage, tinnitus Patient additionally denies: deep pain in ear canal, eustachian tube symptoms such as popping, crackling, sensitive to pressure changes Patient also denies barotrauma, vestibular suppressant use, ototoxic medication use Prior ear surgery: no  Personal or FHx of bleeding dz or anesthesia difficulty: no  GLP-1: no AP/AC: no  Tobacco: no  PMHx: Cervical radiculopathy  Independent Review of Additional Tests or Records:  Dr. Gloria Lares (IM) 05/16/2023 notes reviewed and uploaded or available in chart: noted ear swollen, both ears, right worse than left; he does do MMA and had prior redness and swelling, resolved. Ref to ENT  PMH/Meds/All/SocHx/FamHx/ROS:  History reviewed. No pertinent past medical history.   History reviewed. No pertinent surgical history.  Family History  Problem Relation Age of Onset   Healthy Mother    Healthy Father      Social Connections: Unknown  (03/18/2022)   Received from [facility]   Social Network    Social Network: Not on file      Current Outpatient Medications:    celecoxib  (CELEBREX ) 100 MG capsule, Take 1 capsule (100 mg total) by mouth 2 (two) times daily. (Patient not taking: Reported on 08/03/2023), Disp: 60 capsule, Rfl: 5   gabapentin  (NEURONTIN ) 300 MG capsule, Take 2 capsules (600 mg total) by mouth 3 (three) times daily. (Patient not taking: Reported on 08/03/2023), Disp: 180 capsule, Rfl: 5   meloxicam  (MOBIC ) 15 MG tablet, Take 1 tablet (15 mg total) by mouth daily. After meal (Patient not taking: Reported on 08/03/2023), Disp: 30 tablet, Rfl: 5   valACYclovir (VALTREX) 1000 MG tablet, 2 tablets as needed Orally every 12 hrs for 5 days, Disp: , Rfl:    Physical Exam:   BP 123/69 (BP Location: Right Arm, Patient Position: Sitting, Cuff Size: Large)   Pulse (!) 57   Ht 5\' 10"  (1.778 m)   Wt 186 lb (84.4 kg)   SpO2 95%   BMI 26.69 kg/m   Salient findings:  CN II-XII intact  Bilateral EAC clear and TM intact with well pneumatized middle ear spaces; left auricle morphologically normal; right auricle with "cauliflower ear" deformity primarily involving the antihelix area; helical rim and concha without any scarring; no current hematoma Anterior rhinoscopy: Septum deviates left; bilateral inferior turbinates without significant hypertrophy No lesions of oral cavity/oropharynx No obviously palpable neck masses/lymphadenopathy/thyromegaly No respiratory distress or stridor  Seprately Identifiable Procedures:   None  Impression & Plans:  Oscar Adams is a 43 y.o. male who does martial arts now with:  1. Acquired  ear deformity     Right ear deformity most likely result of hematoma. No active hematoma. We discussed options - given he still plans to do martial arts - do not think excision or other interventions would provide long-term benefit. Not bothering him cosmetically. We discussed padding strategies and  importance of prompt follow up for any future hematoma. He understood. Call in case of any questions F/u as necessary  See below regarding exact medications prescribed this encounter including dosages and route: No orders of the defined types were placed in this encounter.     Thank you for allowing me the opportunity to care for your patient. Please do not hesitate to contact me should you have any other questions.  Sincerely, Milon Aloe, MD Otolaryngologist (ENT), [facility] Phone: [phone] Fax: [phone]  08/03/2023, 11:23 AM   Of note: date of service was 08/03/2023 so no charge on 08/02/2023  I have personally spent 32 minutes involved in face-to-face and non-face-to-face activities for this patient on the day of the visit.  Professional time spent excludes any procedures performed but includes the following activities, in addition to those noted in the documentation: preparing to see the patient (review of outside documentation and results), performing a medically appropriate examination, counseling documenting in the electronic health record

## 2024-03-19 ENCOUNTER — Telehealth (INDEPENDENT_AMBULATORY_CARE_PROVIDER_SITE_OTHER): Payer: Self-pay | Admitting: Otolaryngology

## 2024-03-19 NOTE — Telephone Encounter (Signed)
 Patient called in wanting an appointment for tomorrow.  Told patient we have no openings - offered Thursday appointment - patient stated he would be out of town and needed the ear drained tomorrow or there would be permanent damage.  Montie Gunner, CMA then got on the phone with the patient and let him know that we do not have any openings and if he felt it was necessary - to go to the ED
